# Patient Record
Sex: Male | Born: 1935 | Race: White | Hispanic: No | Marital: Married
Health system: Southern US, Community
[De-identification: ages and names within clinical notes are randomized; demographics above are authoritative.]

---

## 2004-07-18 ENCOUNTER — Ambulatory Visit: Payer: Self-pay | Admitting: Gastroenterology

## 2009-03-21 ENCOUNTER — Ambulatory Visit: Payer: Self-pay | Admitting: Urology

## 2009-03-23 ENCOUNTER — Emergency Department: Payer: Self-pay | Admitting: Emergency Medicine

## 2009-03-28 ENCOUNTER — Ambulatory Visit: Payer: Self-pay | Admitting: Urology

## 2009-03-31 ENCOUNTER — Ambulatory Visit: Payer: Self-pay | Admitting: Urology

## 2009-04-12 ENCOUNTER — Ambulatory Visit: Payer: Self-pay | Admitting: Urology

## 2009-04-26 ENCOUNTER — Ambulatory Visit: Payer: Self-pay | Admitting: Urology

## 2009-05-04 ENCOUNTER — Ambulatory Visit: Payer: Self-pay | Admitting: Urology

## 2009-05-24 ENCOUNTER — Ambulatory Visit: Payer: Self-pay | Admitting: Urology

## 2009-07-27 ENCOUNTER — Ambulatory Visit: Payer: Self-pay | Admitting: Family Medicine

## 2009-07-30 ENCOUNTER — Ambulatory Visit: Payer: Self-pay | Admitting: Oncology

## 2009-08-04 ENCOUNTER — Ambulatory Visit: Payer: Self-pay | Admitting: Oncology

## 2009-08-09 ENCOUNTER — Ambulatory Visit: Payer: Self-pay | Admitting: Oncology

## 2009-08-16 ENCOUNTER — Ambulatory Visit: Payer: Self-pay | Admitting: General Surgery

## 2009-08-24 ENCOUNTER — Inpatient Hospital Stay: Payer: Self-pay | Admitting: General Surgery

## 2009-08-30 ENCOUNTER — Ambulatory Visit: Payer: Self-pay | Admitting: Oncology

## 2009-08-31 ENCOUNTER — Ambulatory Visit: Payer: Self-pay | Admitting: Oncology

## 2009-09-08 ENCOUNTER — Ambulatory Visit: Payer: Self-pay | Admitting: General Surgery

## 2009-09-27 ENCOUNTER — Ambulatory Visit: Payer: Self-pay | Admitting: Oncology

## 2009-10-06 ENCOUNTER — Ambulatory Visit: Payer: Self-pay | Admitting: General Surgery

## 2009-12-28 ENCOUNTER — Ambulatory Visit: Payer: Self-pay | Admitting: Oncology

## 2010-01-02 ENCOUNTER — Ambulatory Visit: Payer: Self-pay | Admitting: Oncology

## 2010-01-04 ENCOUNTER — Ambulatory Visit: Payer: Self-pay | Admitting: Oncology

## 2010-01-27 ENCOUNTER — Ambulatory Visit: Payer: Self-pay | Admitting: Oncology

## 2010-04-29 ENCOUNTER — Ambulatory Visit: Payer: Self-pay | Admitting: Oncology

## 2010-05-05 ENCOUNTER — Ambulatory Visit: Payer: Self-pay | Admitting: Urology

## 2010-05-08 ENCOUNTER — Ambulatory Visit: Payer: Self-pay | Admitting: Oncology

## 2010-05-10 ENCOUNTER — Ambulatory Visit: Payer: Self-pay | Admitting: Oncology

## 2010-05-30 ENCOUNTER — Ambulatory Visit: Payer: Self-pay | Admitting: Oncology

## 2010-09-08 ENCOUNTER — Ambulatory Visit: Payer: Self-pay | Admitting: Oncology

## 2010-09-11 ENCOUNTER — Ambulatory Visit: Payer: Self-pay | Admitting: Oncology

## 2010-09-28 ENCOUNTER — Ambulatory Visit: Payer: Self-pay | Admitting: Oncology

## 2011-03-09 ENCOUNTER — Ambulatory Visit: Payer: Self-pay | Admitting: Oncology

## 2011-03-12 ENCOUNTER — Ambulatory Visit: Payer: Self-pay | Admitting: Oncology

## 2011-03-31 ENCOUNTER — Ambulatory Visit: Payer: Self-pay | Admitting: Oncology

## 2011-08-13 ENCOUNTER — Inpatient Hospital Stay: Payer: Self-pay | Admitting: Internal Medicine

## 2011-08-13 LAB — CBC
HCT: 44.7 % (ref 40.0–52.0)
HGB: 14.9 g/dL (ref 13.0–18.0)
MCH: 31.6 pg (ref 26.0–34.0)
MCV: 95 fL (ref 80–100)
RBC: 4.72 10*6/uL (ref 4.40–5.90)
WBC: 17.5 10*3/uL — ABNORMAL HIGH (ref 3.8–10.6)

## 2011-08-13 LAB — COMPREHENSIVE METABOLIC PANEL
Albumin: 3.7 g/dL (ref 3.4–5.0)
Alkaline Phosphatase: 72 U/L (ref 50–136)
Anion Gap: 9 (ref 7–16)
BUN: 21 mg/dL — ABNORMAL HIGH (ref 7–18)
Calcium, Total: 9.2 mg/dL (ref 8.5–10.1)
Glucose: 137 mg/dL — ABNORMAL HIGH (ref 65–99)
Potassium: 4.2 mmol/L (ref 3.5–5.1)
SGOT(AST): 24 U/L (ref 15–37)
SGPT (ALT): 21 U/L
Total Protein: 7.2 g/dL (ref 6.4–8.2)

## 2011-08-13 LAB — TROPONIN I: Troponin-I: <0.02 ng/mL

## 2011-08-13 LAB — URINALYSIS, COMPLETE
Blood: NEGATIVE
Glucose,UR: NEGATIVE mg/dL (ref 0–75)
Hyaline Cast: 1
Nitrite: NEGATIVE
Protein: NEGATIVE
RBC,UR: <1 /HPF (ref 0–5)
Specific Gravity: 1.02 (ref 1.003–1.030)
WBC UR: 1 /HPF (ref 0–5)

## 2011-08-13 LAB — RAPID INFLUENZA A&B ANTIGENS

## 2011-08-14 LAB — BASIC METABOLIC PANEL
Anion Gap: 9 (ref 7–16)
BUN: 21 mg/dL — ABNORMAL HIGH (ref 7–18)
Chloride: 105 mmol/L (ref 98–107)
Co2: 28 mmol/L (ref 21–32)
Osmolality: 286 (ref 275–301)
Potassium: 4 mmol/L (ref 3.5–5.1)

## 2011-08-14 LAB — CBC WITH DIFFERENTIAL/PLATELET
Basophil %: 0 %
HCT: 40.1 % (ref 40.0–52.0)
Lymphocyte %: 6.5 %
MCHC: 32.9 g/dL (ref 32.0–36.0)
MCV: 95 fL (ref 80–100)
Monocyte %: 5.2 %
Neutrophil #: 17 10*3/uL — ABNORMAL HIGH (ref 1.4–6.5)
RBC: 4.24 10*6/uL — ABNORMAL LOW (ref 4.40–5.90)
WBC: 19.3 10*3/uL — ABNORMAL HIGH (ref 3.8–10.6)

## 2011-08-14 LAB — CLOSTRIDIUM DIFFICILE BY PCR

## 2011-08-14 LAB — WBCS, STOOL

## 2011-08-15 LAB — BASIC METABOLIC PANEL
Anion Gap: 8 (ref 7–16)
BUN: 21 mg/dL — ABNORMAL HIGH (ref 7–18)
Chloride: 107 mmol/L (ref 98–107)
Co2: 25 mmol/L (ref 21–32)
Creatinine: 1.4 mg/dL — ABNORMAL HIGH (ref 0.60–1.30)
EGFR (African American): >60
EGFR (Non-African Amer.): 52 — ABNORMAL LOW
Sodium: 140 mmol/L (ref 136–145)

## 2011-08-15 LAB — CBC WITH DIFFERENTIAL/PLATELET
Basophil #: 0 10*3/uL (ref 0.0–0.1)
Basophil %: 0 %
Eosinophil #: 0.1 10*3/uL (ref 0.0–0.7)
Eosinophil %: 1 %
HCT: 41.3 % (ref 40.0–52.0)
HGB: 13.6 g/dL (ref 13.0–18.0)
Lymphocyte #: 1.2 10*3/uL (ref 1.0–3.6)
MCH: 31.5 pg (ref 26.0–34.0)
MCHC: 33 g/dL (ref 32.0–36.0)
MCV: 95 fL (ref 80–100)
Monocyte #: 0.6 10*3/uL (ref 0.0–0.7)
Neutrophil #: 9.9 10*3/uL — ABNORMAL HIGH (ref 1.4–6.5)

## 2011-08-19 LAB — CULTURE, BLOOD (SINGLE)

## 2011-09-10 ENCOUNTER — Ambulatory Visit: Payer: Self-pay | Admitting: Oncology

## 2011-09-14 ENCOUNTER — Ambulatory Visit: Payer: Self-pay | Admitting: Oncology

## 2011-09-28 ENCOUNTER — Ambulatory Visit: Payer: Self-pay | Admitting: Oncology

## 2011-10-31 ENCOUNTER — Other Ambulatory Visit: Payer: Self-pay | Admitting: Gastroenterology

## 2011-10-31 IMAGING — CT CT CHEST W/ CM
2 series · 15 of 31 positions shown, 19 images · non-contrast
Comparison: none

REASON FOR EXAM: left upper lobe mass
COMMENTS:

[Series 2: soft tissue · axial · 0.75mm/px · z∈[-1161,-1111]mm · 2 of 68 slices shown]
[im 6/68  mediastinal]
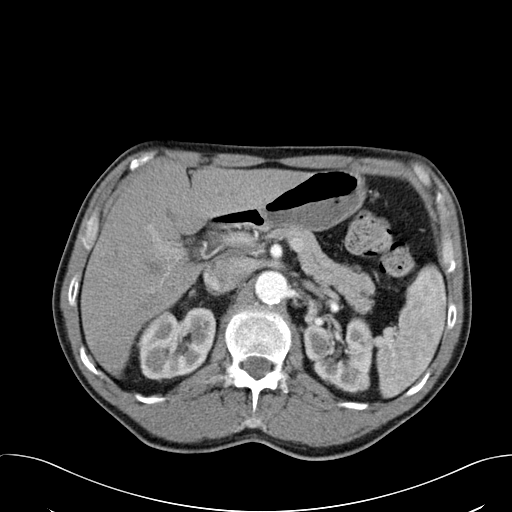
[im 16/68  mediastinal]
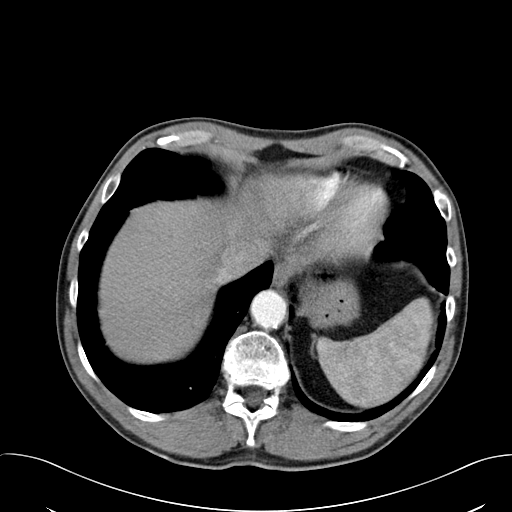

[Series 3: lung windows · axial · 0.75mm/px · z∈[-1151,-876]mm · 13 of 65 slices shown, 17 images]
[im 5/65  mediastinal]
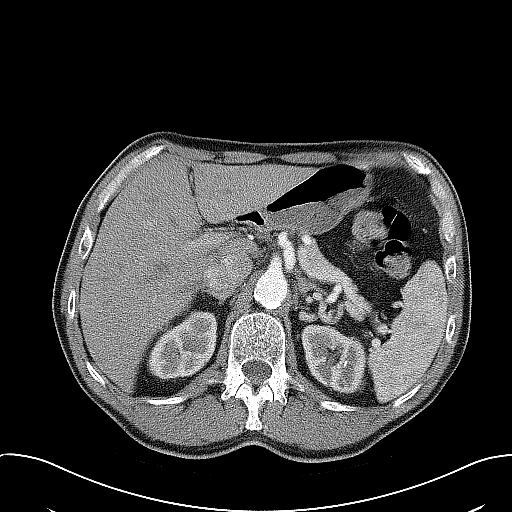
[im 5/65  lung]
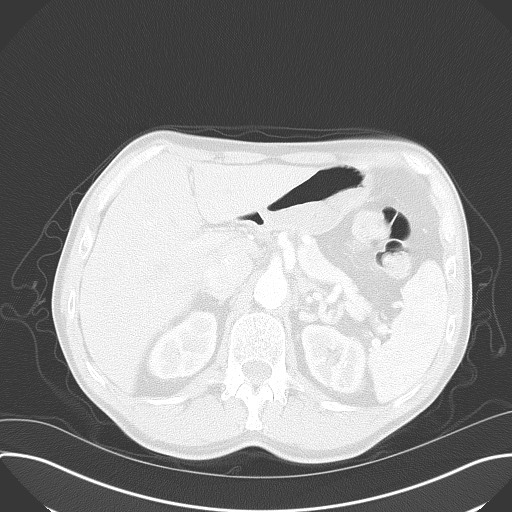
[im 10/65  lung]
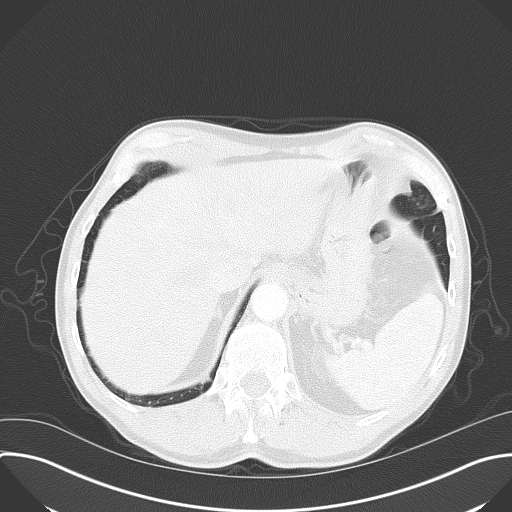
[im 15/65  lung]
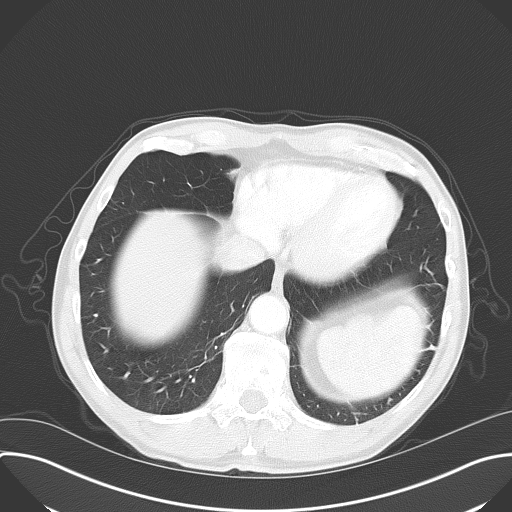
[im 20/65  lung]
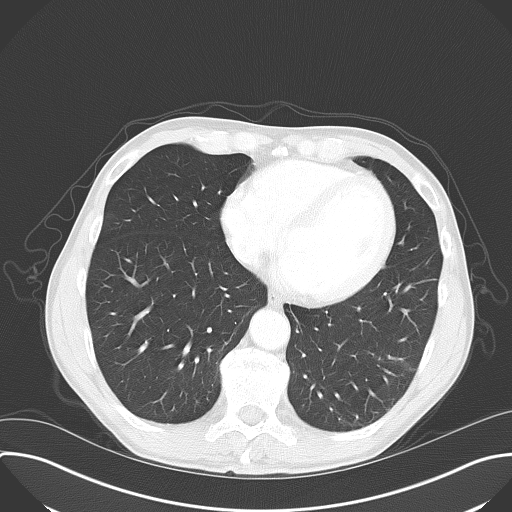
[im 25/65  mediastinal]
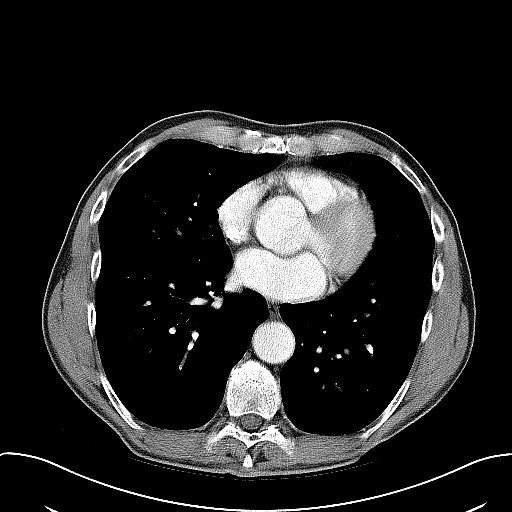
[im 25/65  lung]
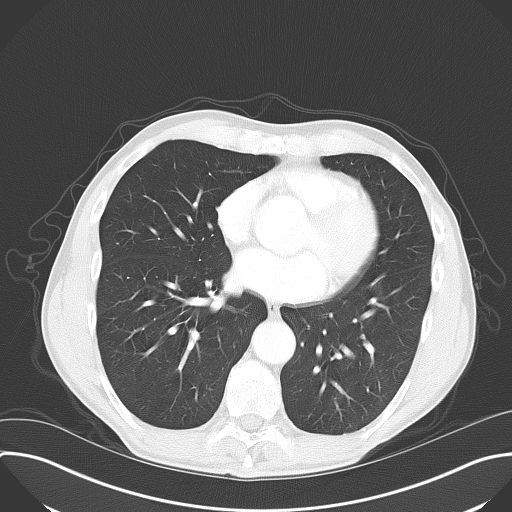
[im 30/65  lung]
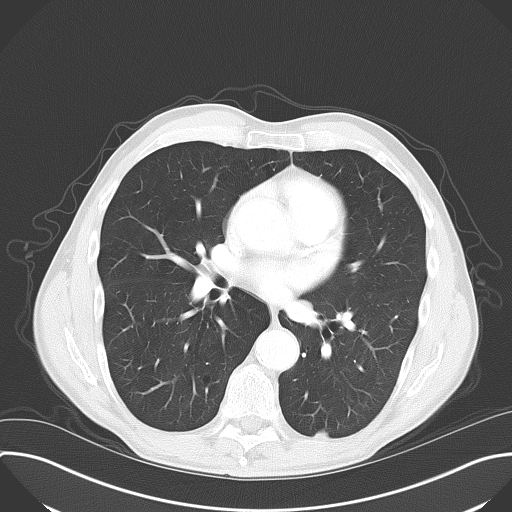
[im 33/65  lung]
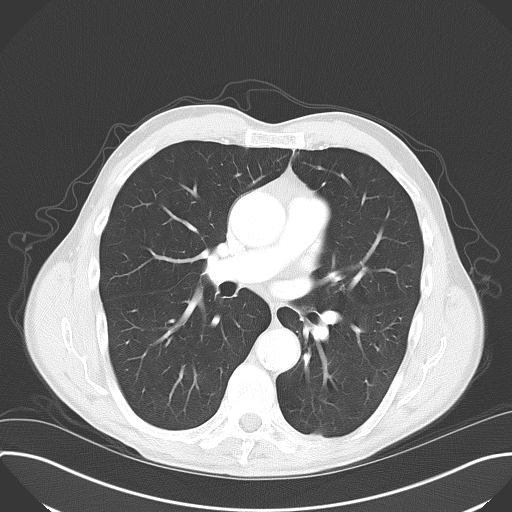
[im 35/65  lung]
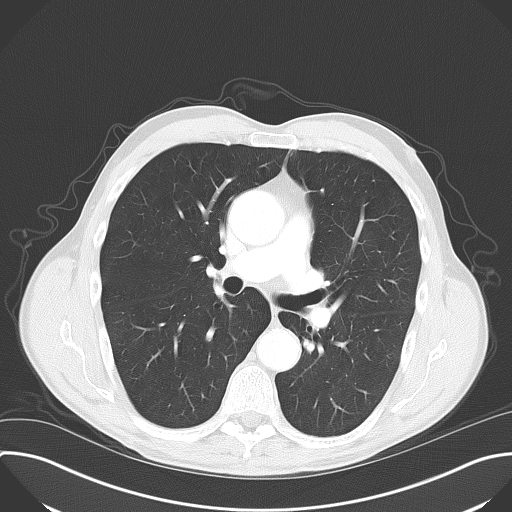
[im 40/65  mediastinal]
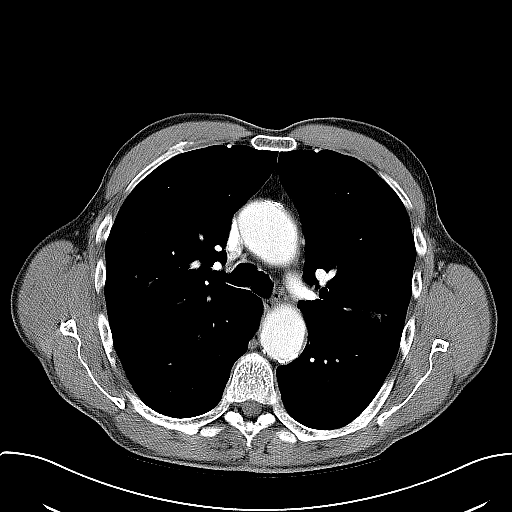
[im 40/65  lung]
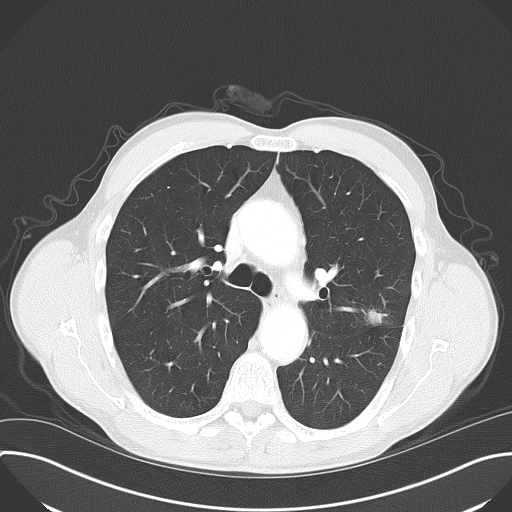
[im 45/65  lung]
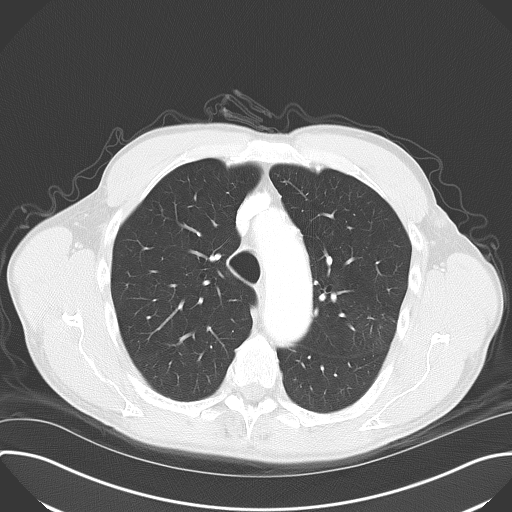
[im 50/65  lung]
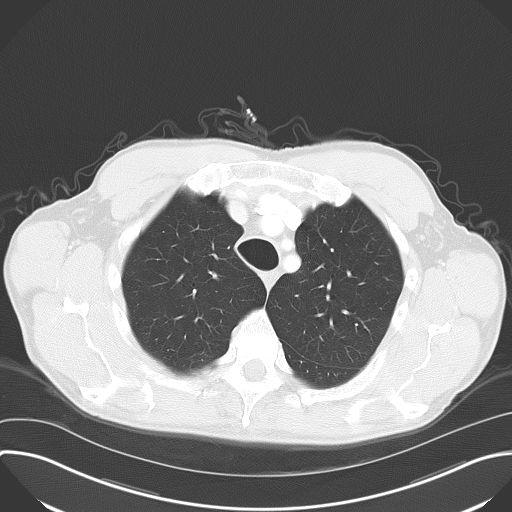
[im 55/65  lung]
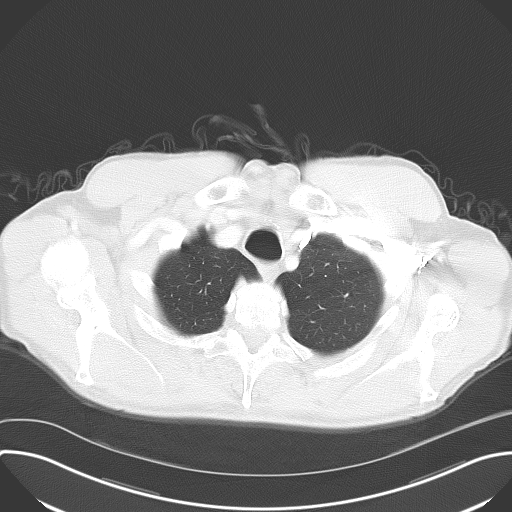
[im 60/65  mediastinal]
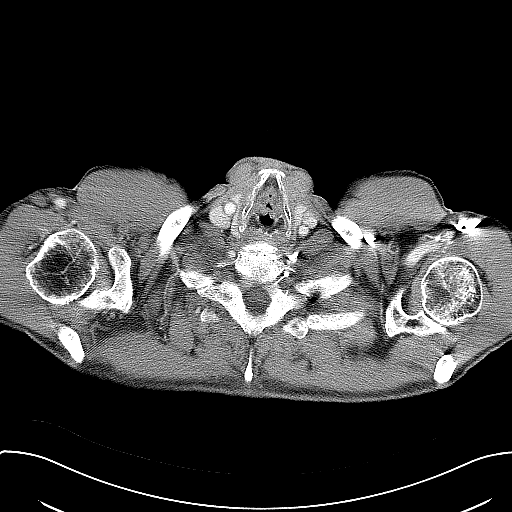
[im 60/65  lung]
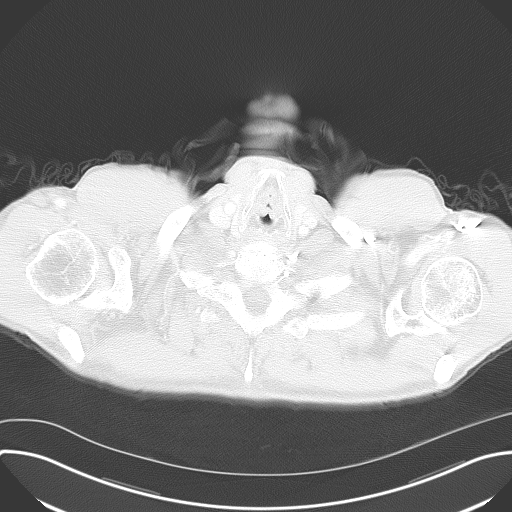

[15 of 31 positions shown; findings below may reference images not displayed]

PROCEDURE:     CT  - CT CHEST WITH CONTRAST  - July 27, 2009  [DATE]

RESULT:     Axial CT scanning was performed through the chest at 5 mm
intervals and slice thicknesses following intravenous administration of 70
cc of Isovue 370. Review of 3-dimensional reconstructed images was performed
separately on the webspace server monitor.

There are emphysematous changes involving both lungs. In the posterior
aspect of the left upper lobe adjacent to the major fissure there is an
abnormal soft tissue density mass. It measures 1.5 cm transversely by 1.3 cm
AP x 1.7 cm in superior to inferior dimension. On image 36 posteriorly in
the left lower lobe there is a pleural-based 1 cm area of nodularity which
is nonspecific.

At mediastinal window settings I see no pathologic sized lymph nodes. Is
likely a sebaceous cyst in the left axillary region. The cardiac chambers
are normal in size. The caliber of the thoracic aorta is normal. There is no
pleural nor pericardial effusion. Within the upper abdomen the observed
portions of the liver and spleen appear normal. I see no adrenal masses.
There is prominent thoracic kyphosis without evidence of a compression
fracture.
IMPRESSION: 1. There is an abnormal soft tissue density nodule posteriorly in the left
upper lobe adjacent to the major fissure. This likely corresponds to the
reported chest x-ray abnormality in the left upper lobe. This is worrisome
for malignancy. I do not see definite abnormal nodules elsewhere within the
lungs. There are emphysematous changes.
2. I see no mediastinal nor hilar lymphadenopathy nor evidence of a pleural
effusion.

Followup oncologic consultation and possible PET/CT scanning may be useful.

## 2011-11-05 ENCOUNTER — Ambulatory Visit: Payer: Self-pay | Admitting: Gastroenterology

## 2011-11-30 IMAGING — CR DG CHEST 1V PORT
1 series · 1 of 1 positions shown · non-contrast
Comparison: none

REASON FOR EXAM: post left lower lobectomy
COMMENTS:

[view not recorded]
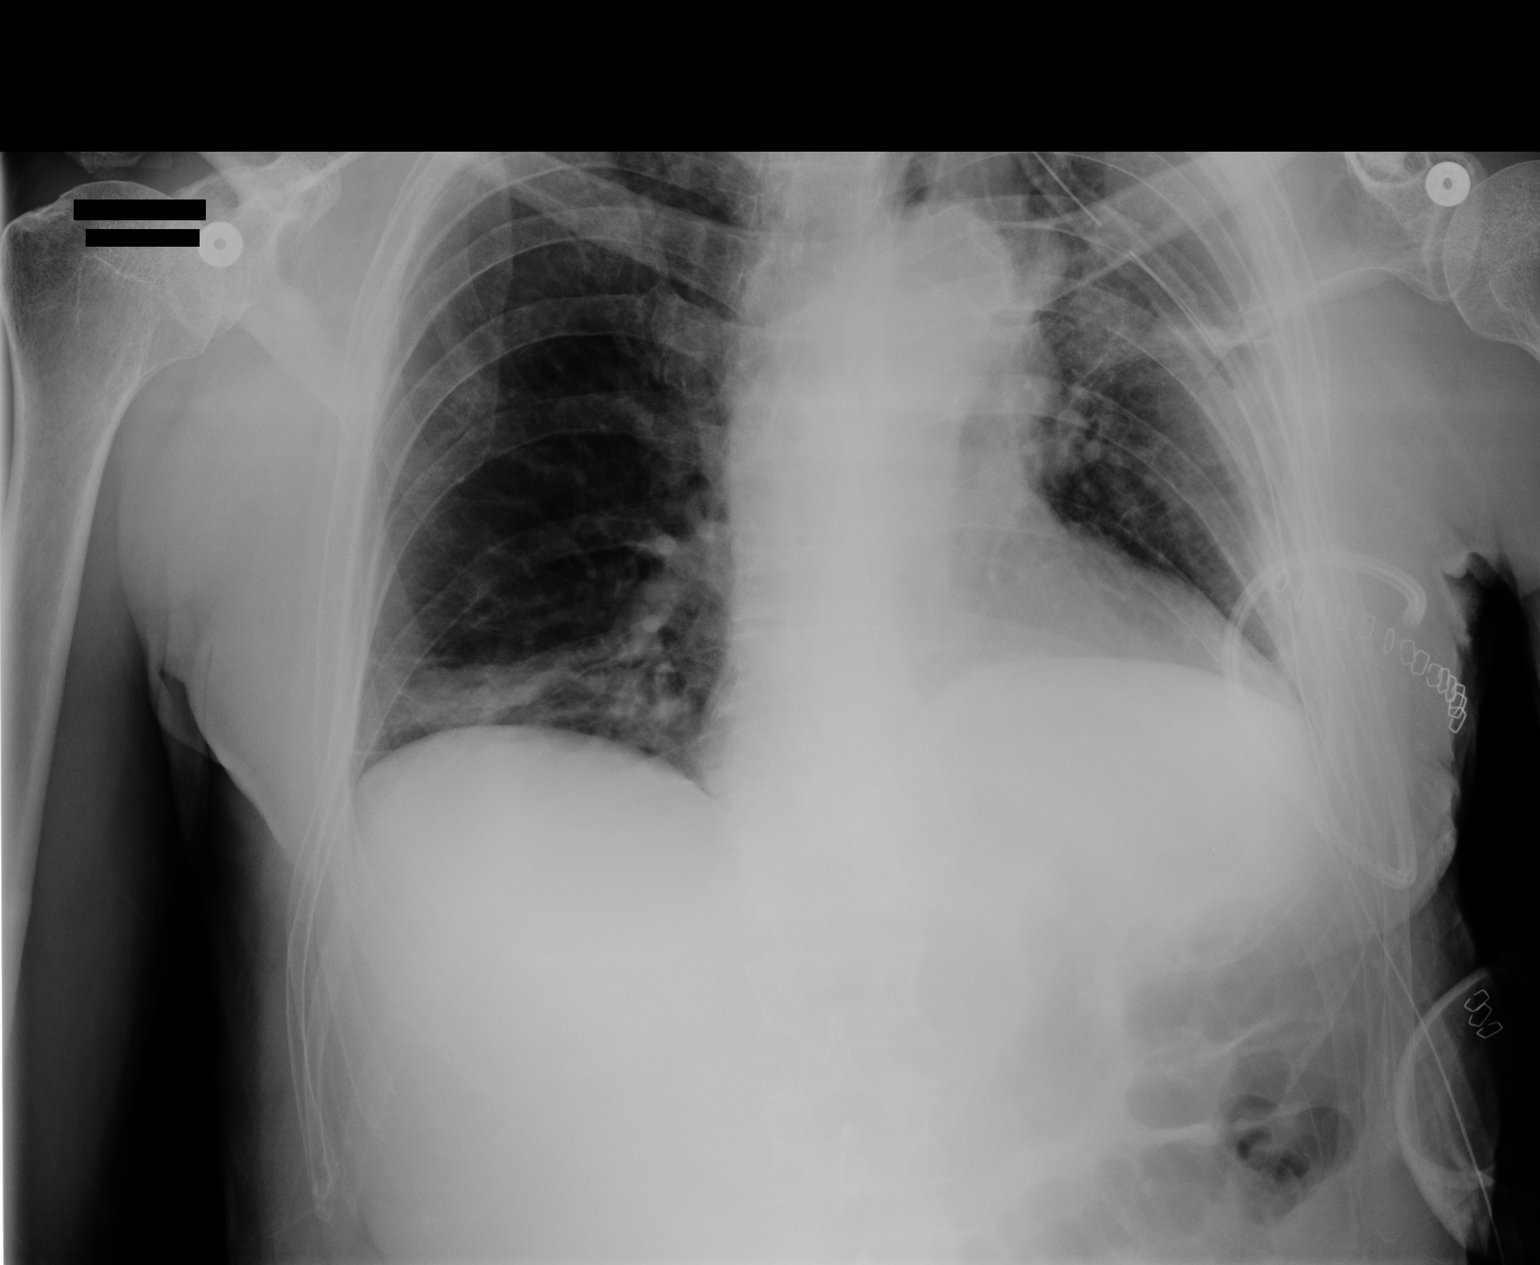

[1 of 1 positions shown; findings below may reference images not displayed]

PROCEDURE:     DXR - DXR PORTABLE CHEST SINGLE VIEW  - August 26, 2009  [DATE]

RESULT:     Comparison is made to prior study dated 08/25/09.

A left-sided chest tube is appreciated with tip at the level of the left
lung apex. There is no evidence of an appreciable pneumothorax. The patient
has taken a shallow inspiration. There is slight prominence of the
interstitial markings. No focal regions of consolidation are appreciated.
Mild increased density projects in the right lung base. The cardiac
silhouette is moderately enlarged. The visualized bony skeleton is
unremarkable.
IMPRESSION: 1. Left-sided chest tube without radiographic evidence of an appreciable
pneumothorax.

2. Atelectasis versus infiltrate in the region of the right lower lobe.
Surveillance evaluation recommended.

## 2011-12-02 IMAGING — CR DG CHEST 1V PORT
1 series · 1 of 1 positions shown · non-contrast
Comparison: none

REASON FOR EXAM: left lower lobectomy
COMMENTS:

[view not recorded]
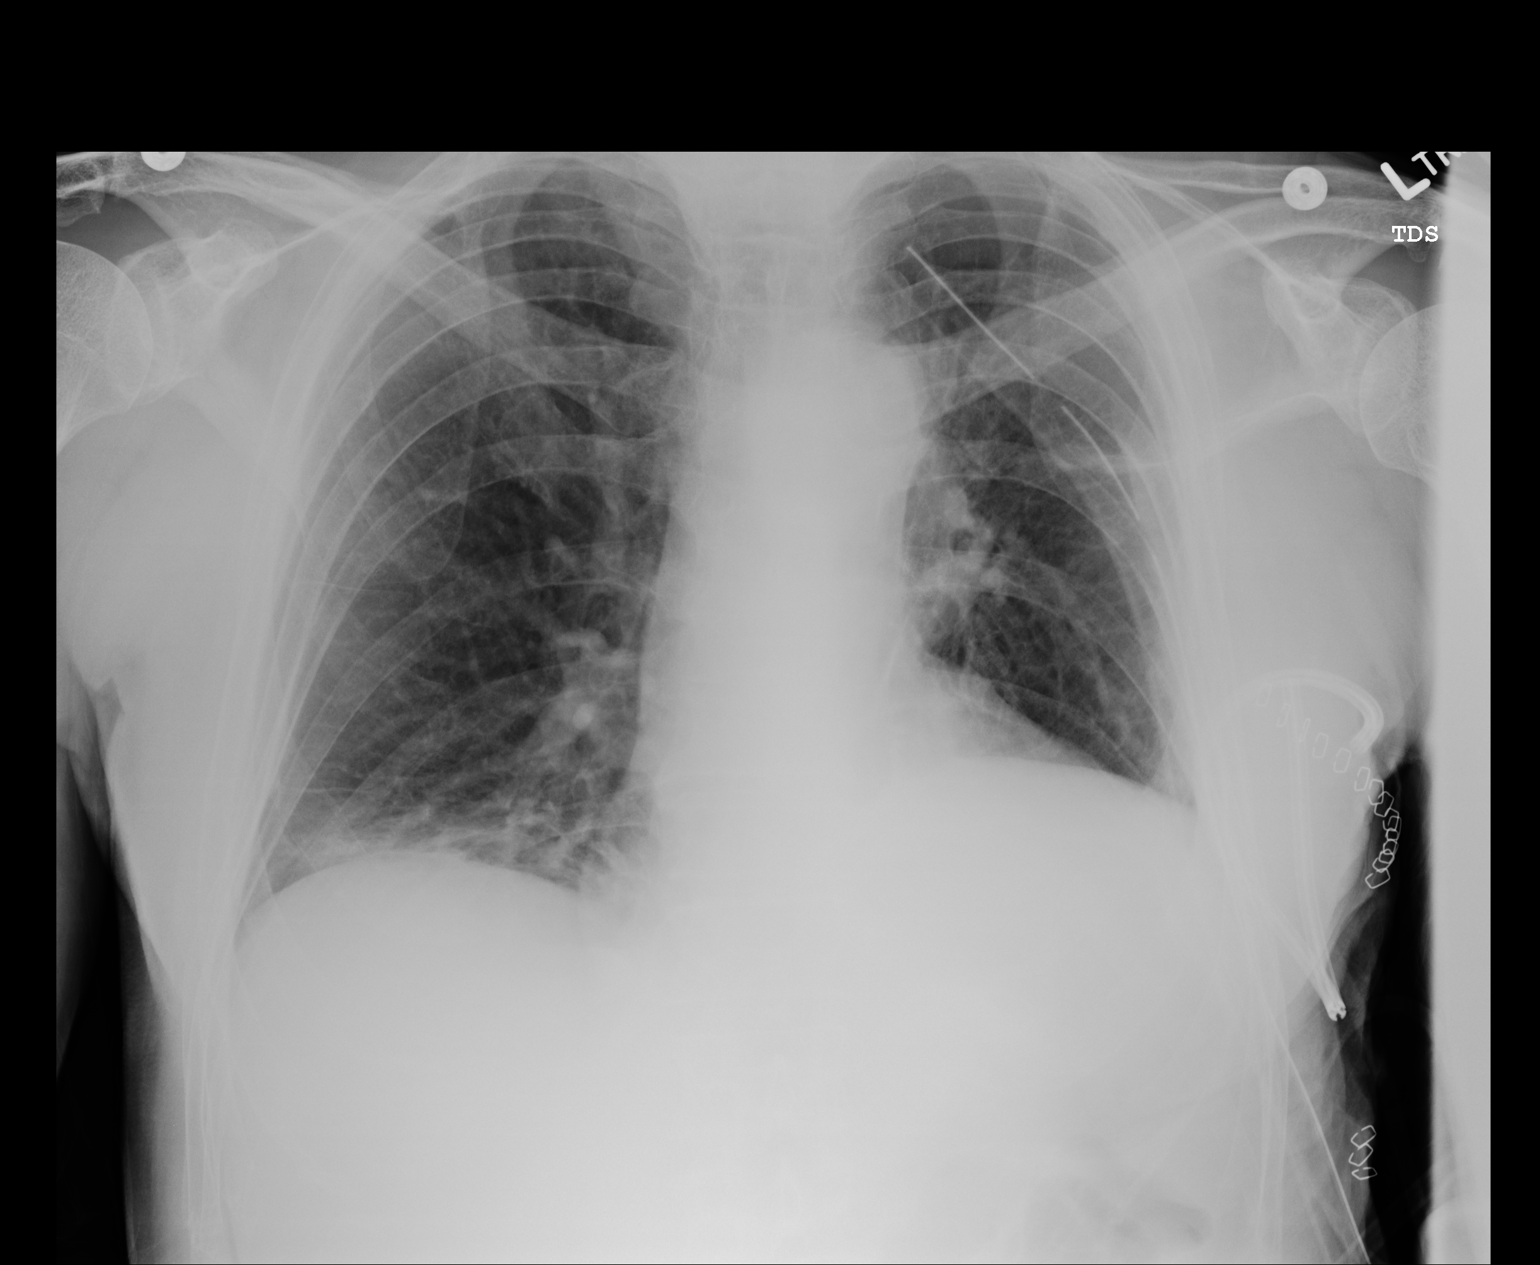

[1 of 1 positions shown; findings below may reference images not displayed]

PROCEDURE:     DXR - DXR PORTABLE CHEST SINGLE VIEW  - August 28, 2009  [DATE]

RESULT:     Comparison is made to a prior study dated 08/26/2009.

A left-sided chest tube is appreciated with the tip projecting in the region
of the left upper lobe. There is no evidence of appreciable pneumothorax. No
focal regions of consolidation are appreciated. An area of increased density
projects in the right lung base. The cardiac silhouette is within normal
limits. The visualized bony skeleton is unremarkable. There is thickening of
the interstitial markings.
IMPRESSION: 1. Atelectasis versus infiltrate right lung base.
2. Pulmonary vascular congestion/mild edema.
3. No evidence of appreciable pneumothorax.
4. Continued surveillance evaluation recommended.

## 2012-03-20 ENCOUNTER — Ambulatory Visit: Payer: Self-pay | Admitting: Oncology

## 2012-03-30 ENCOUNTER — Ambulatory Visit: Payer: Self-pay | Admitting: Oncology

## 2012-08-30 ENCOUNTER — Ambulatory Visit: Payer: Self-pay | Admitting: Oncology

## 2012-09-23 ENCOUNTER — Ambulatory Visit: Payer: Self-pay | Admitting: Oncology

## 2012-09-23 LAB — CREATININE, SERUM
Creatinine: 1.47 mg/dL — ABNORMAL HIGH (ref 0.60–1.30)
EGFR (Non-African Amer.): 45 — ABNORMAL LOW

## 2012-09-27 ENCOUNTER — Ambulatory Visit: Payer: Self-pay | Admitting: Oncology

## 2013-03-25 ENCOUNTER — Ambulatory Visit: Payer: Self-pay | Admitting: Oncology

## 2013-03-30 ENCOUNTER — Ambulatory Visit: Payer: Self-pay | Admitting: Oncology

## 2013-05-14 ENCOUNTER — Encounter: Payer: Self-pay | Admitting: Neurology

## 2013-05-30 ENCOUNTER — Encounter: Payer: Self-pay | Admitting: Neurology

## 2013-06-29 ENCOUNTER — Encounter: Payer: Self-pay | Admitting: Neurology

## 2014-04-29 DEATH — deceased

## 2014-11-21 NOTE — Discharge Summary (Signed)
PATIENT NAME:  Scott Kent, Scott Kent MR#:  [id number] DATE OF BIRTH:  06-Oct-1931  DATE OF ADMISSION:  08/13/2011 DATE OF DISCHARGE:  08/15/2011   ADMITTING PHYSICIAN: Hilda Lias, MD   DISCHARGING PHYSICIAN: Larena Glassman, MD   PRIMARY CARE PHYSICIAN: Wonda Cheng, MD   ADMITTING DIAGNOSIS: Altered mental status and confusion.   DISCHARGE DIAGNOSES:  1. Altered mental status, confusion secondary to delirium from Clostridium difficile infection.  2. Fever of unknown origin probably due to Clostridium difficile versus viral upper respiratory infection.  3. Acute renal failure. 4. Recent urinary tract infection/bronchitis. 5. Hypertension. 6. Diarrhea. 7. Clostridium difficile.   CONSULTANT: Case Management    TESTS DONE DURING THIS HOSPITALIZATION:  1. Chest x-ray on 08/13/2011 showed no acute disease of the chest. 2. CT of the head 08/13/2011 showed no acute intracranial process.  3. MRI of the brain 08/15/2011 showed no acute intracranial changes. Mild chronic microangiopathy. No evidence of intracranial metastatic disease. Complete opacification of the left maxillary sinus.   HOSPITAL COURSE: Initial history and physical were done by Dr. Cherlynn Kaiser. Please refer to his note dated 08/13/2011 for complete details. In brief, the patient is a 81 year old white male with past medical history of benign prostatic hypertrophy, history of lung cancer status post surgery, and hypertension who came in with altered mental status and fever. The patient recently had been treated for viral illness and bronchitis with Cefdinir, Tessalon Perles, Flonase, and albuterol. He became confused. He was found to have low-grade temperature of 100.7 in the ER.  1. For his altered mental status, this was most likely secondary to delirium from Clostridium difficile or viral URI. The patient came back to his baseline fairly quickly. He had CT of the head, urinalysis, and chest x-ray that showed no acute abnormalities. He  also had MRI of the brain due to his history of lung cancer which showed no acute intracranial abnormalities. He initially received vancomycin. Blood culture showed no growth. Urinalysis showed no growth. He developed diarrhea and stool was positive for Clostridium difficile and was started on Flagyl. We discontinued Zosyn as he initially received Zosyn in the ER as well.  2. Fever of unknown origin, etiology unclear. Influenza swab was negative. This most likely was viral versus Clostridium difficile.  3. Acute renal failure. This resolved with IV fluids. We avoided nephrotoxins.  4. Hypertension. We continued Toprol.  5. Diarrhea, Clostridium difficile colitis. The patient was put on oral Flagyl for 14 days and will follow-up with his primary care physician.  6. DVT prophylaxis. Maintained with TED stockings and SCDs.   The patient was discharged on 08/15/2011. Temperature 97.9, heart rate 59, respiratory rate 18, blood pressure 145/80, sating 98% on room air. LUNGS: Clear to auscultation. CARDIOVASCULAR: Regular rate and rhythm. ABDOMEN: Benign.   DISCHARGE MEDICATIONS:  1. Metoprolol succinate 25 mg daily.  2. Zyrtec 10 mg daily. 3. One-A-Day Men 50 one tablet daily.  4. Avodart 0.5 mg daily.  5. Benzonatate 200 mg 1 capsule three times a day.  6. Fish Oil 1000 mg 1 capsule once a day. 7. Fluticasone 50 mcg two sprays in each nostril two times a day. 8. Phazyme Ultra 180 mg as needed. 9. TUMS 500 mg chew and swallow 1 tablet as needed.  10. ProAir HFA 90 mcg inhaled 2 puffs as needed.  11. Flagyl 500 mg p.o. t.i.d., 42 tablets provided.   ACTIVITY: As tolerated.   DIET: Low sodium.  FOLLOW-UP: The patient is to see Dr. Marguerite Olea in  five days.   CODE STATUS: FULL CODE.   TOTAL TIME SPENT ON DISCHARGE: 45 minutes.   Thank you for allowing me to participate in the care of this patient.   ____________________________ Corie ChiquitoAmir A. Lafayette DragonFirozvi, MD aaf:drc D: 08/17/2011 14:47:41  ET T: 08/17/2011 17:04:05 ET JOB#: [id number]  cc: Karolee OhsAmir A. Lafayette DragonFirozvi, MD, <Dictator> Durward MallardJoel B. Marguerite OleaMoffett, MD Karolee OhsAMIR Laverda PageA Dawnell Bryant MD ELECTRONICALLY SIGNED 08/18/2011 8:33

## 2014-11-21 NOTE — H&P (Signed)
PATIENT NAME:  Scott Kent, Aaro L MR#:  [id number] DATE OF BIRTH:  09/19/1931  DATE OF ADMISSION:  08/13/2011  PRIMARY CARE PHYSICIAN: Dr. Wonda ChengJoel Moffett.   CHIEF COMPLAINT: Altered mental status and confusion.   HISTORY OF PRESENT ILLNESS: This is a 81 year old male who was brought to the hospital by his wife due to altered mental status and confusion. As per the wife who gives most of the history, the patient when he woke up this morning was a bit disoriented. He was asking for family members who were not around. He was asking also about a party that was not happening. He apparently went to the bathroom and was asking for toilet paper when there was some there and on top of that he was looking at the roll of toilet paper and blowing air through it. The patient has never done this before. She says that he does have some short-term memory loss and sometimes has to be redirected, but not to the extent that he has had since this morning. She was a bit confused and therefore brought into the hospital for further evaluation. The patient did have a fever of 100.7 at triage. Prior to coming to the hospital about a week or so ago, the patient had been suffering from a viral illness and was treated for bronchitis with some Cefdinir, Tessalon Perles and also some Flonase and albuterol inhaler. Clinically, he has improved but was still found to be confused this morning.   REVIEW OF SYSTEMS: CONSTITUTIONAL: Positive fever of 100.7. No weight gain or weight loss. HEENT: No blurry or double vision. ENT: No tinnitus or postnasal drip. No redness of the oropharynx. RESPIRATORY: Positive cough. No wheeze. No hemoptysis, no dyspnea. CARDIOVASCULAR: No chest pain, no orthopnea, no palpitations or syncope. GASTROINTESTINAL: No nausea, no vomiting, no diarrhea, no abdominal pain, no melena, no hematochezia. GU: No dysuria or hematuria. ENDOCRINE: No polyuria or nocturia. No heat or cold intolerance. HEME: No anemia, no bruising. No  bleeding. INTEGUMENTARY: No rashes, no lesions. MUSCULOSKELETAL: No arthritis, no swelling, and no gout. NEUROLOGIC: No numbness, no tingling, no ataxia, no seizure-type activity. PSYCH: No anxiety, no insomnia, no ADD.   PAST MEDICAL HISTORY:  1. Benign prostatic hypertrophy. 2. History of lung cancer status post surgery.  3. Hypertension.   ALLERGIES: No known drug allergies.   SOCIAL HISTORY: No smoking. No alcohol abuse. No illicit drug abuse. Lives at home with his wife.   FAMILY HISTORY: No family history of coronary artery disease or diabetes.   CURRENT MEDICATIONS:  1. Avodart 0.5 mg daily. 2. Tessalon Perles as needed. 3. Cefdinir 300 mg 2 caps daily. He has three doses left.  4. Fish oil 1000 mg daily.  5. Flonase two sprays to each nostril b.i.d.  6. Toprol 25 mg daily.  7. Multivitamin daily.  8. ProAir 2 puffs as needed. 9. Tums daily.  10. Zyrtec 10 mg daily.   PHYSICAL EXAMINATION ON ADMISSION:  VITAL SIGNS: Temperature 100.7, pulse 104, respirations 20, blood pressure 130/59, sats 98% on room air.   GENERAL: He is a pleasant appearing male in no apparent distress.   HEENT: Atraumatic, normocephalic. Extraocular muscles are intact. Pupils are equal and reactive to light. Sclerae anicteric. No conjunctival injection. No oropharyngeal erythema.   NECK: Supple. No jugular venous distention. No bruits, no lymphadenopathy, no thyromegaly.   HEART: Regular rate and rhythm. No murmurs, no rubs, no clicks.   LUNGS: Clear to auscultation bilaterally. No rales, no rhonchi, no wheezes.  ABDOMEN: Soft, flat, nontender, nondistended. Has good bowel sounds. No hepatosplenomegaly appreciated.   EXTREMITIES: No evidence of any cyanosis, clubbing, or peripheral edema. Has +2 pedal and radial pulses bilaterally.   NEUROLOGIC: The patient is alert, awake, and oriented x3 with no focal motor or sensory deficits appreciated bilaterally.   SKIN: Moist and warm with no rash  appreciated.   LYMPHATIC: There is no cervical or axillary lymphadenopathy.   LABORATORY, DIAGNOSTIC, AND RADIOLOGICAL DATA: Serum glucose 137, BUN 21, creatinine 1.5, sodium 139, potassium 4.2, chloride 105, bicarbonate 25. LFTs are within normal limits. Troponin less than 0.02. White cell count 17.5, hemoglobin 14.9, hematocrit 44.7, platelet count 133. Urinalysis is within normal limits.   The patient did have a chest x-ray done which showed no acute cardiopulmonary disease. Also, had a CT of the head done which showed no evidence of any acute intracranial abnormality.   ASSESSMENT AND PLAN: This is a 81 year old male with a history of hypertension, benign prostatic hypertrophy, and a history of lung cancer status post surgery, who presented to the hospital with fever and altered mental status.  1. Altered mental status. The exact etiology is currently unclear. The patient's mental status is now back to baseline. It is likely related to the febrile illness although the source is unclear. His CT of the head is negative. His urinalysis is negative. His chest x-ray is normal. My suspicion for meningitis is low given the fact that his mental status is back to baseline. Questionable if it is bronchitis versus possible underlying cerebrovascular accident. We will go ahead and empirically start the patient on Zosyn and vancomycin. Follow his cultures. Follow him clinically and follow his mental status. I will also go ahead and get an MRI of his brain as he does have a history of lung cancer.  2. Fever of unknown origin. Again, etiology is currently unclear. As mentioned, his urinalysis and chest x-ray negative. I will check a nasal swab for flu. Continue empiric Vanc or Zosyn. Follow cultures. My suspicion for meningitis is fairly low. Therefore, will hold off on doing a lumbar puncture at this point.  3. Acute renal failure. Continue with IV fluids. We will follow his BUN and creatinine and his urine output.  Renal dose his medications and avoid nephrotoxins. I presume this is related to his febrile illness and dehydration.  4. Hypertension. Continue with Toprol.  5. Benign prostatic hypertrophy. Continue with Avodart. There is no evidence of any urinary obstruction.   CODE STATUS: The patient is a FULL CODE.   TIME SPENT WITH ADMISSION: 50 minutes.  ____________________________ Rolly Pancake. Cherlynn Kaiser, MD vjs:ap D: 08/13/2011 16:35:44 ET T: 08/13/2011 17:30:31 ET JOB#: [id number]  cc: Rolly Pancake. Cherlynn Kaiser, MD, <Dictator> Durward Mallard. Marguerite Olea, MD [place] Siren MD ELECTRONICALLY SIGNED 08/13/2011 18:46
